# Patient Record
Sex: Male | Born: 1990 | Race: White | Hispanic: No | Marital: Single | Smoking: Current every day smoker
Health system: Southern US, Community
[De-identification: ages and names within clinical notes are randomized; demographics above are authoritative.]

## PROBLEM LIST (undated history)

## (undated) DIAGNOSIS — Z9889 Other specified postprocedural states: Secondary | ICD-10-CM

---

## 2012-09-12 ENCOUNTER — Emergency Department (HOSPITAL_COMMUNITY): Payer: Self-pay

## 2012-09-12 ENCOUNTER — Emergency Department (HOSPITAL_COMMUNITY)
Admission: EM | Admit: 2012-09-12 | Discharge: 2012-09-12 | Disposition: A | Discharge status: Home / Self Care | Payer: Self-pay | Attending: Emergency Medicine | Admitting: Emergency Medicine

## 2012-09-12 ENCOUNTER — Encounter (HOSPITAL_COMMUNITY): Payer: Self-pay | Admitting: Emergency Medicine

## 2012-09-12 DIAGNOSIS — Y9389 Activity, other specified: Secondary | ICD-10-CM | POA: Insufficient documentation

## 2012-09-12 DIAGNOSIS — S43005A Unspecified dislocation of left shoulder joint, initial encounter: Secondary | ICD-10-CM

## 2012-09-12 DIAGNOSIS — Y929 Unspecified place or not applicable: Secondary | ICD-10-CM | POA: Insufficient documentation

## 2012-09-12 DIAGNOSIS — Z9889 Other specified postprocedural states: Secondary | ICD-10-CM | POA: Insufficient documentation

## 2012-09-12 DIAGNOSIS — S43006A Unspecified dislocation of unspecified shoulder joint, initial encounter: Secondary | ICD-10-CM | POA: Insufficient documentation

## 2012-09-12 DIAGNOSIS — R296 Repeated falls: Secondary | ICD-10-CM | POA: Insufficient documentation

## 2012-09-12 DIAGNOSIS — Z880 Allergy status to penicillin: Secondary | ICD-10-CM | POA: Insufficient documentation

## 2012-09-12 HISTORY — DX: Other specified postprocedural states: Z98.890

## 2012-09-12 MED ORDER — MIDAZOLAM HCL 2 MG/2ML IJ SOLN
2.0000 mg | Freq: Once | INTRAMUSCULAR | Status: AC
  Administered 2012-09-12: 2 mg via INTRAVENOUS
  Filled 2012-09-12: qty 2

## 2012-09-12 MED ORDER — FENTANYL CITRATE 0.05 MG/ML IJ SOLN
100.0000 ug | Freq: Once | INTRAMUSCULAR | Status: AC
  Administered 2012-09-12: 100 ug via INTRAVENOUS
  Filled 2012-09-12: qty 2

## 2012-09-12 MED ORDER — OXYCODONE-ACETAMINOPHEN 5-325 MG PO TABS: 1.0000 | ORAL_TABLET | ORAL | Status: DC | PRN

## 2012-09-12 MED ORDER — MIDAZOLAM HCL 2 MG/2ML IJ SOLN
INTRAMUSCULAR | Status: DC | PRN
  Administered 2012-09-12: 2 mg via INTRAVENOUS

## 2012-09-12 MED ORDER — NAPROXEN 500 MG PO TABS: 500.0000 mg | ORAL_TABLET | Freq: Two times a day (BID) | ORAL | Status: DC

## 2012-09-12 MED ORDER — ETOMIDATE 2 MG/ML IV SOLN
20.0000 mg | Freq: Once | INTRAVENOUS | Status: AC
  Administered 2012-09-12: 20 mg via INTRAVENOUS
  Filled 2012-09-12: qty 10

## 2012-09-12 MED ORDER — ETOMIDATE 2 MG/ML IV SOLN: 20.0000 mg/kg | Freq: Once | INTRAVENOUS | Status: DC

## 2012-09-12 MED ORDER — ETOMIDATE 2 MG/ML IV SOLN
INTRAVENOUS | Status: DC | PRN
  Administered 2012-09-12 (×2): 10 mg via INTRAVENOUS

## 2012-09-12 MED ORDER — OXYCODONE-ACETAMINOPHEN 5-325 MG PO TABS
2.0000 | ORAL_TABLET | Freq: Once | ORAL | Status: AC
  Administered 2012-09-12: 2 via ORAL
  Filled 2012-09-12: qty 2

## 2012-09-12 NOTE — ED Provider Notes (Addendum)
History     CSN: [id number]  Arrival date & time 09/12/12  1638   First MD Initiated Contact with Patient 09/12/12 1655      Chief Complaint  Patient presents with  . Dislocation    (Consider location/radiation/quality/duration/timing/severity/associated sxs/prior treatment) HPI Comments: 22 year old male with a history of recurrent left shoulder dislocations who is currently incarcerated who presents with a complaint of recurrent left shoulder pain after sustaining a fall yesterday. He was found to have a dislocated shoulder which was unable to be reduced by the medical team at his facility thus he was transferred to the hospital. This pain is persistent, severe, worse with range of motion, not associated with other acute injuries.  The history is provided by the patient.    Past Medical History  Diagnosis Date  . H/O shoulder surgery     History reviewed. No pertinent past surgical history.  No family history on file.  History  Substance Use Topics  . Smoking status: Not on file  . Smokeless tobacco: Not on file  . Alcohol Use: Not on file      Review of Systems  All other systems reviewed and are negative.    Allergies  Hydrocodone and Penicillins  Home Medications   Current Outpatient Rx  Name  Route  Sig  Dispense  Refill  . acetaminophen-codeine (TYLENOL #3) 300-30 MG per tablet   Oral   Take 2 tablets by mouth once.         . naproxen (NAPROSYN) 500 MG tablet   Oral   Take 1 tablet (500 mg total) by mouth 2 (two) times daily with a meal.   30 tablet   0   . oxyCODONE-acetaminophen (PERCOCET) 5-325 MG per tablet   Oral   Take 1 tablet by mouth every 4 (four) hours as needed for pain.   10 tablet   0     BP 136/59  Pulse 54  Temp(Src) 98.5 F (36.9 C) (Oral)  Resp 20  Ht 6' (1.829 m)  Wt 202 lb (91.627 kg)  BMI 27.39 kg/m2  SpO2 98%  Physical Exam  Nursing note and vitals reviewed. Constitutional: He appears well-developed and  well-nourished. No distress.  HENT:  Head: Normocephalic and atraumatic.  Mouth/Throat: Oropharynx is clear and moist. No oropharyngeal exudate.  Eyes: Conjunctivae and EOM are normal. Pupils are equal, round, and reactive to light. Right eye exhibits no discharge. Left eye exhibits no discharge. No scleral icterus.  Neck: Normal range of motion. Neck supple. No JVD present. No thyromegaly present.  Cardiovascular: Normal rate, regular rhythm, normal heart sounds and intact distal pulses.  Exam reveals no gallop and no friction rub.   No murmur heard. Normal CRT of the L hand  Pulmonary/Chest: Effort normal and breath sounds normal. No respiratory distress. He has no wheezes. He has no rales.  Abdominal: Soft. Bowel sounds are normal. He exhibits no distension and no mass. There is no tenderness.  Musculoskeletal: Normal range of motion. He exhibits tenderness. He exhibits no edema.  Deformity and tenderness of the left shoulder. Decreased range of motion secondary to severe pain.  Lymphadenopathy:    He has no cervical adenopathy.  Neurological: He is alert. Coordination normal.  Normal sensation of the L hand, normal motor of the L hand and forearm  Skin: Skin is warm and dry. No rash noted. No erythema.  Psychiatric: He has a normal mood and affect. His behavior is normal.    ED Course  Procedures (including critical care time)  Labs Reviewed - No data to display No results found.   1. Dislocation of left shoulder joint, initial encounter       MDM  Image to rule out fracture, likely anterior inferior dislocation, sedation, pain meds.  Imaging confirms anterior inferior dislocation of the left shoulder. This is consistent with my exam.  Procedure:  Dislocation Reduction of left Shoulder  Consent:  Description of the procedures as well as Risks of procedure as well as the alternatives and risks of each were explained to the (patient/caregiver).  who has verbally expressed  their understanding.   written consent given by  the patient.  Imaging reviewed, anatomic site of dislocation identified and marked, Patient identity confirmed by arm band and with hospital identification number as well as verbally with patient.  Time Out performed at 5:58 PM.  Patient was prepped and draped in the usual sterile fashion. Sedation used yes.  Sedation type: Moderate.  Type of Sedation used: fentanyl, midazolam and etomidate. Total sedation time: 20 minutes.  Reduction method: traction / countertraction.  Vitals were monitored through the procedure.  Complications: none.  Pt tolerated the procedure without complaints and returned to baseline without difficulty.  Post reduction images were obtained confirming successful reduction of dislocation.  Immobilization applied, Neurovascular status reevaluated and is good with normal pulses, sensation and good capillary refill of the affected extremity.  Reduction films ordered and show interval reduction    Meds given in ED:  Medications  fentaNYL (SUBLIMAZE) injection 100 mcg (100 mcg Intravenous Given 09/12/12 1742)  midazolam (VERSED) injection 2 mg (2 mg Intravenous Given 09/12/12 1811)  etomidate (AMIDATE) injection 20 mg (20 mg Intravenous Given 09/12/12 1811)  oxyCODONE-acetaminophen (PERCOCET/ROXICET) 5-325 MG per tablet 2 tablet (2 tablets Oral Given 09/12/12 2005)    Discharge Medication List as of 09/12/2012  6:45 PM    START taking these medications   Details  naproxen (NAPROSYN) 500 MG tablet Take 1 tablet (500 mg total) by mouth 2 (two) times daily with a meal., Starting 09/12/2012, Until Discontinued, Print          Vida Roller, MD 09/18/12 1610  Vida Roller, MD 09/24/12 1746
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
## 2015-07-11 ENCOUNTER — Emergency Department (HOSPITAL_COMMUNITY)
Admission: EM | Admit: 2015-07-11 | Discharge: 2015-07-11 | Disposition: A | Discharge status: Home / Self Care | Payer: Medicaid Other | Attending: Emergency Medicine | Admitting: Emergency Medicine

## 2015-07-11 ENCOUNTER — Emergency Department (HOSPITAL_COMMUNITY): Payer: Medicaid Other

## 2015-07-11 ENCOUNTER — Encounter (HOSPITAL_COMMUNITY): Payer: Self-pay | Admitting: Emergency Medicine

## 2015-07-11 DIAGNOSIS — Z9889 Other specified postprocedural states: Secondary | ICD-10-CM | POA: Insufficient documentation

## 2015-07-11 DIAGNOSIS — Y9389 Activity, other specified: Secondary | ICD-10-CM | POA: Insufficient documentation

## 2015-07-11 DIAGNOSIS — Z791 Long term (current) use of non-steroidal anti-inflammatories (NSAID): Secondary | ICD-10-CM | POA: Insufficient documentation

## 2015-07-11 DIAGNOSIS — Y998 Other external cause status: Secondary | ICD-10-CM | POA: Insufficient documentation

## 2015-07-11 DIAGNOSIS — X58XXXA Exposure to other specified factors, initial encounter: Secondary | ICD-10-CM | POA: Diagnosis not present

## 2015-07-11 DIAGNOSIS — Z880 Allergy status to penicillin: Secondary | ICD-10-CM | POA: Insufficient documentation

## 2015-07-11 DIAGNOSIS — S4992XA Unspecified injury of left shoulder and upper arm, initial encounter: Secondary | ICD-10-CM | POA: Diagnosis present

## 2015-07-11 DIAGNOSIS — S43005A Unspecified dislocation of left shoulder joint, initial encounter: Secondary | ICD-10-CM | POA: Diagnosis not present

## 2015-07-11 DIAGNOSIS — Y9289 Other specified places as the place of occurrence of the external cause: Secondary | ICD-10-CM | POA: Diagnosis not present

## 2015-07-11 MED ORDER — FENTANYL CITRATE (PF) 100 MCG/2ML IJ SOLN: 100.0000 ug | Freq: Once | INTRAMUSCULAR | Status: DC

## 2015-07-11 MED ORDER — OXYCODONE HCL 5 MG PO TABS: 5.0000 mg | ORAL_TABLET | ORAL | Status: DC | PRN

## 2015-07-11 MED ORDER — FENTANYL CITRATE (PF) 100 MCG/2ML IJ SOLN
100.0000 ug | Freq: Once | INTRAMUSCULAR | Status: AC
  Administered 2015-07-11: 100 ug via INTRAVENOUS
  Filled 2015-07-11: qty 2

## 2015-07-11 NOTE — ED Provider Notes (Signed)
CSN: [id number]     Arrival date & time 07/11/15  2200 History   First MD Initiated Contact with Patient 07/11/15 2211     Chief Complaint  Patient presents with  . Shoulder Injury     (Consider location/radiation/quality/duration/timing/severity/associated sxs/prior Treatment) Patient is a 25 y.o. male presenting with shoulder injury. The history is provided by the patient.  Shoulder Injury This is a recurrent problem. The current episode started less than 1 hour ago. The problem occurs constantly. The problem has not changed since onset.Pertinent negatives include no chest pain, no abdominal pain, no headaches and no shortness of breath. Nothing aggravates the symptoms. Nothing relieves the symptoms. He has tried nothing for the symptoms.   25 yo M With a chief complaint of a left shoulder dislocation. Patient has had many of these in the past. States that he's had about 9 shoulder surgeries. Patient states he reached out to grab something that it dropped and when he caught it his arm dislocated. Happened about an hour ago.   Past Medical History  Diagnosis Date  . H/O shoulder surgery    History reviewed. No pertinent past surgical history. History reviewed. No pertinent family history. Social History  Substance Use Topics  . Smoking status: None  . Smokeless tobacco: None  . Alcohol Use: None    Review of Systems  Constitutional: Negative for fever and chills.  HENT: Negative for congestion and facial swelling.   Eyes: Negative for discharge and visual disturbance.  Respiratory: Negative for shortness of breath.   Cardiovascular: Negative for chest pain and palpitations.  Gastrointestinal: Negative for vomiting, abdominal pain and diarrhea.  Musculoskeletal: Positive for myalgias and arthralgias.  Skin: Negative for color change and rash.  Neurological: Negative for tremors, syncope and headaches.  Psychiatric/Behavioral: Negative for confusion and dysphoric mood.       Allergies  Hydrocodone; Ketamine; and Penicillins  Home Medications   Prior to Admission medications   Medication Sig Start Date End Date Taking? Authorizing Provider  acetaminophen-codeine (TYLENOL #3) 300-30 MG per tablet Take 2 tablets by mouth once.    Historical Provider, MD  naproxen (NAPROSYN) 500 MG tablet Take 1 tablet (500 mg total) by mouth 2 (two) times daily with a meal. 09/12/12   Eber Hong, MD  oxyCODONE (ROXICODONE) 5 MG immediate release tablet Take 1 tablet (5 mg total) by mouth every 4 (four) hours as needed for severe pain. 07/11/15   Melene Plan, DO  oxyCODONE-acetaminophen (PERCOCET) 5-325 MG per tablet Take 1 tablet by mouth every 4 (four) hours as needed for pain. 09/12/12   Eber Hong, MD   BP 140/97 mmHg  Pulse 92  Temp(Src) 98.2 F (36.8 C) (Oral)  Resp 16  SpO2 97% Physical Exam  Constitutional: He is oriented to person, place, and time. He appears well-developed and well-nourished.  HENT:  Head: Normocephalic and atraumatic.  Eyes: EOM are normal. Pupils are equal, round, and reactive to light.  Neck: Normal range of motion. Neck supple. No JVD present.  Cardiovascular: Normal rate and regular rhythm.  Exam reveals no gallop and no friction rub.   No murmur heard. Pulmonary/Chest: No respiratory distress. He has no wheezes.  Abdominal: He exhibits no distension. There is no rebound and no guarding.  Musculoskeletal: Normal range of motion. He exhibits edema and tenderness.  Deformity to the left shoulder.  Old scar to anterior aspect.   Neurological: He is alert and oriented to person, place, and time.  Skin: No rash noted.  No pallor.  Psychiatric: He has a normal mood and affect. His behavior is normal.  Nursing note and vitals reviewed.   ED Course  Reduction of dislocation Date/Time: 07/11/2015 10:35 PM Performed by: Adela LankFLOYD, Anahid Eskelson Authorized by: Melene PlanFLOYD, Blaize Epple Consent: Verbal consent obtained. Risks and benefits: risks, benefits and alternatives  were discussed Consent given by: patient Patient understanding: patient states understanding of the procedure being performed Patient consent: the patient's understanding of the procedure matches consent given Required items: required blood products, implants, devices, and special equipment available Patient identity confirmed: verbally with patient Preparation: Patient was prepped and draped in the usual sterile fashion. Local anesthesia used: no Patient sedated: no Patient tolerance: Patient tolerated the procedure well with no immediate complications Comments: L shoulder reduced, attempted park maneuver without reduction.  Caudal distraction with external rotation with clunk and fix of deformity.    (including critical care time) Labs Review Labs Reviewed - No data to display  Imaging Review No results found. I have personally reviewed and evaluated these images and lab results as part of my medical decision-making.   EKG Interpretation None      MDM   Final diagnoses:  Shoulder dislocation, left, initial encounter    25 yo M with a chief complaint of a left shoulder dislocation. Patient requesting relocation without conscious sedation. Performed at bedside. Placed sling discharge home.  10:37 PM:  I have discussed the diagnosis/risks/treatment options with the patient and believe the pt to be eligible for discharge home to follow-up with Ortho. We also discussed returning to the ED immediately if new or worsening sx occur. We discussed the sx which are most concerning (e.g., sudden worsening pain, fever, inability to tolerate by mouth) that necessitate immediate return. Medications administered to the patient during their visit and any new prescriptions provided to the patient are listed below.  Medications given during this visit Medications  fentaNYL (SUBLIMAZE) injection 100 mcg (100 mcg Intravenous Given 07/11/15 2229)    New Prescriptions   OXYCODONE (ROXICODONE) 5 MG  IMMEDIATE RELEASE TABLET    Take 1 tablet (5 mg total) by mouth every 4 (four) hours as needed for severe pain.    The patient appears reasonably screen and/or stabilized for discharge and I doubt any other medical condition or other [facility] requiring further screening, evaluation, or treatment in the ED at this time prior to discharge.      Melene Planan Jackline Castilla, DO 07/11/15 2237

## 2015-07-11 NOTE — Discharge Instructions (Signed)
Take 4 over the counter ibuprofen tablets 3 times a day or 2 over-the-counter naproxen tablets twice a day for pain.  Shoulder Dislocation Your shoulder joint is made up of 3 bones:  The upper arm bone (humerus).  The shoulder blade (scapula).  The collarbone (clavicle). A shoulder dislocation happens when your upper arm bone moves out of its normal place in your shoulder joint. HOME CARE If You Have a Splint or Sling:  Wear it as told by your doctor.  Take it off only as told by your doctor.  Loosen it if:  Your fingers become numb and tingly.  Your fingers turn cold and blue.  Keep it clean and dry. Bathing  Do not take baths, swim, or use a hot tub until your doctor says you can. Ask your doctor if you can take showers. You may only be allowed to take sponge baths.  If your doctor says taking baths or showers is okay, cover your splint or sling with a plastic bag. Do not let the splint or sling get wet. Managing Pain, Stiffness, and Swelling  If told, put ice on the injured area.  Put ice in a plastic bag.  Place a towel between your skin and the bag.  Leave the ice on for 20 minutes, 2-3 times per day.  Move your fingers often to avoid stiffness and to lessen swelling.  Raise (elevate) the injured area above the level of your heart while you are sitting or lying down. Driving  Do not drive while you are wearing a splint or sling on a hand that you use for driving.  Do not drive or operate heavy machinery while taking pain medicine. Activity  Return to your normal activities as told by your doctor. Ask your doctor what activities are safe for you.  Do range-of-motion exercises only as told by your doctor.  Exercise your hand by squeezing a soft ball. This keeps your hand and wrist from getting stiff and swollen. General Instructions  Take over-the-counter and prescription medicines only as told by your doctor.  Do not use any tobacco products, including  cigarettes, chewing tobacco, or e-cigarettes. Tobacco can slow down healing. If you need help quitting, ask your doctor.  Keep all follow-up visits as told by your doctor. This is important. GET HELP IF:  Your splint or sling gets damaged. GET HELP RIGHT AWAY IF:  Your pain gets worse instead of better.  You lose feeling in your arm or hand.  Your arm or hand turns white and cold.   This information is not intended to replace advice given to you by your health care provider. Make sure you discuss any questions you have with your health care provider.   Document Released: 07/17/2011 Document Revised: 01/13/2015 Document Reviewed: 08/17/2014 Elsevier Interactive Patient Education [employer]2016 [employer] Inc.

## 2015-07-11 NOTE — ED Notes (Signed)
Pt states that he feels that he has popped out his L shoulder. States this has happened before and he has had surgery on this arm before. Alert and oriented.

## 2015-09-15 ENCOUNTER — Emergency Department (HOSPITAL_BASED_OUTPATIENT_CLINIC_OR_DEPARTMENT_OTHER): Payer: Medicaid Other

## 2015-09-15 ENCOUNTER — Encounter (HOSPITAL_BASED_OUTPATIENT_CLINIC_OR_DEPARTMENT_OTHER): Payer: Self-pay | Admitting: Emergency Medicine

## 2015-09-15 ENCOUNTER — Emergency Department (HOSPITAL_BASED_OUTPATIENT_CLINIC_OR_DEPARTMENT_OTHER)
Admission: EM | Admit: 2015-09-15 | Discharge: 2015-09-15 | Disposition: A | Discharge status: Home / Self Care | Payer: Medicaid Other | Attending: Emergency Medicine | Admitting: Emergency Medicine

## 2015-09-15 DIAGNOSIS — Y93F2 Activity, caregiving, lifting: Secondary | ICD-10-CM | POA: Insufficient documentation

## 2015-09-15 DIAGNOSIS — X500XXA Overexertion from strenuous movement or load, initial encounter: Secondary | ICD-10-CM | POA: Diagnosis not present

## 2015-09-15 DIAGNOSIS — F17200 Nicotine dependence, unspecified, uncomplicated: Secondary | ICD-10-CM | POA: Diagnosis not present

## 2015-09-15 DIAGNOSIS — Y999 Unspecified external cause status: Secondary | ICD-10-CM | POA: Insufficient documentation

## 2015-09-15 DIAGNOSIS — S4992XA Unspecified injury of left shoulder and upper arm, initial encounter: Secondary | ICD-10-CM | POA: Diagnosis present

## 2015-09-15 DIAGNOSIS — Y92520 Airport as the place of occurrence of the external cause: Secondary | ICD-10-CM | POA: Insufficient documentation

## 2015-09-15 DIAGNOSIS — S43015A Anterior dislocation of left humerus, initial encounter: Secondary | ICD-10-CM | POA: Diagnosis not present

## 2015-09-15 MED ORDER — OXYCODONE-ACETAMINOPHEN 5-325 MG PO TABS: 1.0000 | ORAL_TABLET | ORAL | Status: AC | PRN

## 2015-09-15 MED ORDER — FENTANYL CITRATE (PF) 100 MCG/2ML IJ SOLN
100.0000 ug | Freq: Once | INTRAMUSCULAR | Status: AC
  Administered 2015-09-15: 100 ug via INTRAVENOUS
  Filled 2015-09-15: qty 2

## 2015-09-15 MED ORDER — ETOMIDATE 2 MG/ML IV SOLN
0.1500 mg/kg | Freq: Once | INTRAVENOUS | Status: AC
  Administered 2015-09-15: 13.74 mg via INTRAVENOUS
  Filled 2015-09-15: qty 10

## 2015-09-15 MED ORDER — IBUPROFEN 800 MG PO TABS
800.0000 mg | ORAL_TABLET | Freq: Once | ORAL | Status: AC
  Administered 2015-09-15: 800 mg via ORAL
  Filled 2015-09-15: qty 1

## 2015-09-15 NOTE — ED Notes (Signed)
MD at bedside. 

## 2015-09-15 NOTE — Sedation Documentation (Signed)
Arm placed back in and xray at bedside for portable

## 2015-09-15 NOTE — ED Provider Notes (Signed)
CSN: [id number]     Arrival date & time 09/15/15  0453 History   First MD Initiated Contact with Patient 09/15/15 0455     Chief Complaint  Patient presents with  . Shoulder Injury     (Consider location/radiation/quality/duration/timing/severity/associated sxs/prior Treatment) HPI This is a 25 year old male with a history of multiple dislocations of the left shoulder for which she has had surgery. [place] [facility] [place] to [place] his child when his left shoulder spontaneously dislocated. He rates his pain as a 7 out of 10 and characterizes it as like previous dislocations. There is no numbness or functional deficit of the left upper extremity distally. He denies other injury.  Past Medical History  Diagnosis Date  . H/O shoulder surgery    History reviewed. No pertinent past surgical history. History reviewed. No pertinent family history. Social History  Substance Use Topics  . Smoking status: Current Every Day Smoker  . Smokeless tobacco: None  . Alcohol Use: No    Review of Systems  All other systems reviewed and are negative.   Allergies  Hydrocodone; Ketamine; and Penicillins  Home Medications   Prior to Admission medications   Medication Sig Start Date End Date Taking? Authorizing Provider  oxyCODONE-acetaminophen (PERCOCET) 5-325 MG tablet Take 1 tablet by mouth every 4 (four) hours as needed. 09/15/15   Roxy Filler, MD   BP 130/78 mmHg  Pulse 59  Temp(Src) 98.9 F (37.2 C) (Oral)  Resp 15  Ht 6' (1.829 m)  Wt 195 lb (88.451 kg)  BMI 26.44 kg/m2  SpO2 100%   Physical Exam  General: Well-developed, well-nourished male in no acute distress; appearance consistent with age of record HENT: normocephalic; atraumatic Eyes: pupils equal, round and reactive to light; extraocular muscles intact Neck: supple Heart: regular rate and rhythm Lungs: clear to auscultation bilaterally Abdomen: soft; nondistended; nontender; bowel sounds  present Extremities: Left shoulder deformity with anterior fullness; full range of motion except left shoulder; pulses normal; tenderness and pain on attempted movement of left shoulder; left upper extremity distally neurovascularly intact Neurologic: Awake, alert and oriented; motor function intact in all extremities and symmetric; no facial droop Skin: Warm and dry Psychiatric: Normal mood and affect    ED Course  Procedures (including critical care time)  Procedural sedation Performed by: Sahil Milner L Consent: Verbal and written consent obtained. Risks and benefits: risks, benefits and alternatives were discussed Required items: required blood products, implants, devices, and special equipment available Patient identity confirmed: arm band and provided demographic data Time out: Immediately prior to procedure a "time out" was called to verify the correct patient, procedure, equipment, support staff and site/side marked as required.  Sedation type: moderate (conscious) sedation NPO time confirmed and considedered  Sedatives: ETOMIDATE  Physician Time at Bedside: 20 minutes  Vitals: Vital signs were monitored during sedation. Cardiac Monitor, pulse oximeter Patient tolerance: Patient tolerated the procedure well with no immediate complications. Comments: Pt with uneventful recovered. Returned to pre-procedural sedation baseline  CLOSED REDUCTION The patient initially wanted to avoid procedural sedation. He was given 100 micrograms of fentanyl and an attempt to reduce the left shoulder by hyperextension was unsuccessful. Informed consent both verbal and written was obtained as noted above. He was given etomidate for sedation. Once adequate sedation was obtained the left shoulder was reduced by hyperextension. He was then placed in a shoulder immobilizer. The patient tolerated this well and there were no immediate complications. The left upper extremity remains distally neurovascularly  intact.   MDM  Nursing notes and vitals signs, including pulse oximetry, reviewed.  Summary of this visit's results, reviewed by myself:  Imaging Studies: Dg Shoulder Left Port  09/15/2015  CLINICAL DATA:  Getting luggage out of a car and felt shoulder popped EXAM: LEFT SHOULDER - 1 VIEW COMPARISON:  09/15/2015 at 04:55 FINDINGS: A single portable view of the shoulder demonstrates improved articular relationships of the glenohumeral joint. No dislocation is evident on this single view. No fracture is evident. IMPRESSION: Improved appearances compared to the earlier study. Electronically Signed   By: Ellery Plunk M.D.   On: 09/15/2015 06:05   Dg Shoulder Left Port  09/15/2015  CLINICAL DATA:  Lifting luggage out of a car, and felt his shoulder popped out. EXAM: LEFT SHOULDER - 1 VIEW COMPARISON:  09/12/2012 FINDINGS: A single portable view of the left shoulder demonstrates at least a significant degree of inferior subluxation at the glenohumeral joint. Dislocation cannot be excluded on this single view. No fracture is evident IMPRESSION: Inferior subluxation of the glenohumeral joint. Cannot entirely exclude a frank dislocation. Electronically Signed   By: Ellery Plunk M.D.   On: 09/15/2015 06:04        Paula Libra, MD 09/15/15 [phone]

## 2015-09-15 NOTE — ED Notes (Signed)
Xray at bedside for portable.

## 2015-09-15 NOTE — ED Notes (Signed)
Pt sitting up in bed calling cab [place] get to airport for flight.  Pt a/o x 4

## 2015-09-15 NOTE — ED Notes (Signed)
Pt states he doesn't want to be sedated due to flight and ask to just use pain medication and try to put back in place.

## 2015-09-15 NOTE — Discharge Instructions (Signed)
Shoulder Dislocation °A shoulder dislocation happens when the upper arm bone (humerus) moves out of the shoulder joint. The shoulder joint is the part of the shoulder where the humerus, shoulder blade (scapula), and collarbone (clavicle) meet. °CAUSES °This condition is often caused by: °· A fall. °· A hit to the shoulder. °· A forceful movement of the shoulder. °RISK FACTORS °This condition is more likely to develop in people who play sports. °SYMPTOMS °Symptoms of this condition include: °· Deformity of the shoulder. °· Intense pain. °· Inability to move the shoulder. °· Numbness, weakness, or tingling in your neck or down your arm. °· Bruising or swelling around your shoulder. °DIAGNOSIS °This condition is diagnosed with a physical exam. After the exam, tests may be done to check for related problems. Tests that may be done include: °· X-ray. This may be done to check for broken bones. °· MRI. This may be done to check for damage to the tissues around the shoulder. °· Electromyogram. This may be done to check for nerve damage. °TREATMENT °This condition is treated with a procedure to place the humerus back in the joint. This procedure is called a reduction. There are two types of reduction: °· Closed reduction. In this procedure, the humerus is placed back in the joint without surgery. The health care provider uses his or her hands to guide the bone back into place. °· Open reduction. In this procedure, the humerus is placed back in the joint with surgery. An open reduction may be recommended if: °¨ You have a weak shoulder joint or weak ligaments. °¨ You have had more than one shoulder dislocation. °¨ The nerves or blood vessels around your shoulder have been damaged. °After the humerus is placed back into the joint, your arm will be placed in a splint or sling to prevent it from moving. You will need to wear the splint or sling until your shoulder heals. When the splint or sling is removed, you may have  physical therapy to help improve the range of motion in your shoulder joint. °HOME CARE INSTRUCTIONS °If You Have a Splint or Sling: °· Wear it as told by your health care provider. Remove it only as told by your health care provider. °· Loosen it if your fingers become numb and tingle, or if they turn cold and blue. °· Keep it clean and dry. °Bathing °· Do not take baths, swim, or use a hot tub until your health care provider approves. Ask your health care provider if you can take showers. You may only be allowed to take sponge baths for bathing. °· If your health care provider approves bathing and showering, cover your splint or sling with a watertight plastic bag to protect it from water. Do not let the splint or sling get wet. °Managing Pain, Stiffness, and Swelling °· If directed, apply ice to the injured area. °¨ Put ice in a plastic bag. °¨ Place a towel between your skin and the bag. °¨ Leave the ice on for 20 minutes, 2-3 times per day. °· Move your fingers often to avoid stiffness and to decrease swelling. °· Raise (elevate) the injured area above the level of your heart while you are sitting or lying down. °Driving °· Do not drive while wearing a splint or sling on a hand that you use for driving. °· Do not drive or operate heavy machinery while taking pain medicine. °Activity °· Return to your normal activities as told by your health care provider. Ask your   health care provider what activities are safe for you. °· Perform range-of-motion exercises only as told by your health care provider. °· Exercise your hand by squeezing a soft ball. This helps to decrease stiffness and swelling in your hand and wrist. °General Instructions °· Take over-the-counter and prescription medicines only as told by your health care provider. °· Do not use any tobacco products, including cigarettes, chewing tobacco, or e-cigarettes. Tobacco can delay bone and tissue healing. If you need help quitting, ask your health care  provider. °· Keep all follow-up visits as told by your health care provider. This is important. °SEEK MEDICAL CARE IF: °· Your splint or sling gets damaged. °SEEK IMMEDIATE MEDICAL CARE IF: °· Your pain gets worse rather than better. °· You lose feeling in your arm or hand. °· Your arm or hand becomes white and cold. °  °This information is not intended to replace advice given to you by your health care provider. Make sure you discuss any questions you have with your health care provider. °  °Document Released: 01/17/2001 Document Revised: 01/13/2015 Document Reviewed: 08/17/2014 °Elsevier Interactive Patient Education ©2016 [employer] ° °

## 2015-09-15 NOTE — ED Notes (Signed)
Unable to place arm back in without sedation

## 2015-09-15 NOTE — Sedation Documentation (Signed)
Immobilizer placed on arm

## 2015-09-15 NOTE — ED Notes (Signed)
Pt states he was [place] and getting luggage out of car and his shoulder popped out.  Hx of dislocation and surgeries in that arm. Left side

## 2020-02-04 ENCOUNTER — Encounter (HOSPITAL_COMMUNITY): Payer: Self-pay

## 2020-02-04 ENCOUNTER — Emergency Department (HOSPITAL_COMMUNITY): Payer: Medicaid Other

## 2020-02-04 ENCOUNTER — Other Ambulatory Visit: Payer: Self-pay

## 2020-02-04 ENCOUNTER — Emergency Department (HOSPITAL_COMMUNITY)
Admission: EM | Admit: 2020-02-04 | Discharge: 2020-02-05 | Disposition: A | Discharge status: Home / Self Care | Payer: Medicaid Other | Attending: Emergency Medicine | Admitting: Emergency Medicine

## 2020-02-04 DIAGNOSIS — Z20822 Contact with and (suspected) exposure to covid-19: Secondary | ICD-10-CM | POA: Insufficient documentation

## 2020-02-04 DIAGNOSIS — F17200 Nicotine dependence, unspecified, uncomplicated: Secondary | ICD-10-CM | POA: Diagnosis not present

## 2020-02-04 DIAGNOSIS — X501XXA Overexertion from prolonged static or awkward postures, initial encounter: Secondary | ICD-10-CM | POA: Insufficient documentation

## 2020-02-04 DIAGNOSIS — S43005A Unspecified dislocation of left shoulder joint, initial encounter: Secondary | ICD-10-CM

## 2020-02-04 DIAGNOSIS — S4992XA Unspecified injury of left shoulder and upper arm, initial encounter: Secondary | ICD-10-CM | POA: Diagnosis present

## 2020-02-04 DIAGNOSIS — G8929 Other chronic pain: Secondary | ICD-10-CM

## 2020-02-04 DIAGNOSIS — S43002A Unspecified subluxation of left shoulder joint, initial encounter: Secondary | ICD-10-CM

## 2020-02-04 DIAGNOSIS — M25512 Pain in left shoulder: Secondary | ICD-10-CM

## 2020-02-04 MED ORDER — FENTANYL CITRATE (PF) 100 MCG/2ML IJ SOLN: 100.0000 ug | Freq: Once | INTRAMUSCULAR | Status: AC

## 2020-02-04 MED ORDER — PROPOFOL 10 MG/ML IV BOLUS
200.0000 mg | Freq: Once | INTRAVENOUS | Status: AC
  Administered 2020-02-04: 200 mg via INTRAVENOUS
  Filled 2020-02-04: qty 20

## 2020-02-04 MED ORDER — FENTANYL CITRATE (PF) 100 MCG/2ML IJ SOLN
INTRAMUSCULAR | Status: AC
  Administered 2020-02-04: 100 ug via INTRAVENOUS
  Filled 2020-02-04: qty 2

## 2020-02-04 MED ORDER — PROPOFOL 10 MG/ML IV BOLUS
INTRAVENOUS | Status: AC
  Filled 2020-02-04: qty 20

## 2020-02-04 MED ORDER — FENTANYL CITRATE (PF) 100 MCG/2ML IJ SOLN
100.0000 ug | Freq: Once | INTRAMUSCULAR | Status: AC
Start: 2020-02-04 — End: 2020-02-04
  Administered 2020-02-04: 100 ug via INTRAVENOUS
  Filled 2020-02-04: qty 2

## 2020-02-04 NOTE — ED Triage Notes (Signed)
Pt reports left shoulder pops out every time he has handcuffs places. Pt in [employer] custody.

## 2020-02-04 NOTE — ED Provider Notes (Signed)
[facility]-EMERGENCY DEPT Provider Note   CSN: [id number] Arrival date & time: 02/04/20  2027    History Shoulder dislocation   Richard Wiley is a 29 y.o. male with past medical history significant for recurrent shoulder dislocation who presents for vaginal shoulder pain.  Patient was being placed under arrest by police. Subsequently dislocated his left shoulder. Has previously had surgery on his shoulder. Was previously followed by [facility] and [facility] with Ortho. No currently being followed by Ortho. Pain a 4/10. No fever, chills, redness, swelling warmth, paresthesias.  History obtained from patient and past medical records. No interpretor was used.  HPI     Past Medical History:  Diagnosis Date  . H/O shoulder surgery     There are no problems to display for this patient.   History reviewed. No pertinent surgical history.     No family history on file.  Social History   Tobacco Use  . Smoking status: Current Every Day Smoker  Substance Use Topics  . Alcohol use: No  . Drug use: No    Home Medications Prior to Admission medications   Medication Sig Start Date End Date Taking? Authorizing Provider  oxyCODONE-acetaminophen (PERCOCET) 5-325 MG tablet Take 1 tablet by mouth every 4 (four) hours as needed. 09/15/15   Molpus, John, MD    Allergies    Hydrocodone, Ketamine, Penicillins, and Hydrocodone-acetaminophen  Review of Systems   Review of Systems  Constitutional: Negative.   HENT: Negative.   Respiratory: Negative.   Cardiovascular: Negative.   Gastrointestinal: Negative.   Genitourinary: Negative.   Musculoskeletal:       Left shoulder dislocation  Skin: Negative.   Neurological: Negative.   All other systems reviewed and are negative.   Physical Exam Updated Vital Signs BP 126/68   Pulse 71   Temp 98.6 F (37 C) (Oral)   Resp 13   Ht 6' (1.829 m)   Wt 97.5 kg   SpO2 98%   BMI 29.16 kg/m   Physical Exam Vitals and  nursing note reviewed.  Constitutional:      General: He is not in acute distress.    Appearance: He is well-developed. He is not ill-appearing, toxic-appearing or diaphoretic.  HENT:     Head: Normocephalic and atraumatic.     Nose: Nose normal.  Eyes:     Pupils: Pupils are equal, round, and reactive to light.  Cardiovascular:     Rate and Rhythm: Normal rate and regular rhythm.     Pulses: Normal pulses.          Radial pulses are 2+ on the right side and 2+ on the left side.  Pulmonary:     Effort: Pulmonary effort is normal. No respiratory distress.     Breath sounds: Normal breath sounds.  Abdominal:     General: Bowel sounds are normal. There is no distension.     Palpations: Abdomen is soft.  Musculoskeletal:     Right shoulder: Normal.     Left shoulder: Decreased range of motion.     Cervical back: Normal range of motion and neck supple.     Comments: Squared off left shoulder with obvious dislocation. No erythema, warmth.  Skin:    General: Skin is warm and dry.     Capillary Refill: Capillary refill takes less than 2 seconds.  Neurological:     Mental Status: He is alert.     Sensory: Sensation is intact.     Motor: Motor  function is intact.     Coordination: Coordination is intact.     Comments: Intact sensation to BUE Equal hand grip bilaterally    ED Results / Procedures / Treatments   Labs (all labs ordered are listed, but only abnormal results are displayed) Labs Reviewed - No data to display  EKG None  Radiology DG Shoulder Left  Result Date: 02/04/2020 CLINICAL DATA:  Left shoulder pain. EXAM: LEFT SHOULDER - 2+ VIEW COMPARISON:  August 23, 2018 FINDINGS: Anterior and inferior dislocation of the left humeral head is seen with respect to the left glenoid. No associated fracture is identified. There is no evidence of arthropathy or other focal bone abnormality. Soft tissues are unremarkable. IMPRESSION: Anterior and inferior dislocation of the left  shoulder. Electronically Signed   By: Aram Candela M.D.   On: 02/04/2020 21:43   DG Shoulder Left Portable  Result Date: 02/05/2020 CLINICAL DATA:  Status post reduction. EXAM: LEFT SHOULDER COMPARISON:  February 04, 2020 (9:27 p.m.) FINDINGS: There is no evidence of fracture or dislocation. There is no evidence of arthropathy or other focal bone abnormality. Soft tissues are unremarkable. IMPRESSION: Successful reduction of the left shoulder dislocation seen on the prior study. Electronically Signed   By: Aram Candela M.D.   On: 02/05/2020 00:07    Procedures .Ortho Injury Treatment  Date/Time: 02/05/2020 12:09 AM Performed by: Ralph Leyden A, PA-C Authorized by: Linwood Dibbles, PA-C   Consent:    Consent obtained:  Verbal   Consent given by:  Patient   Risks discussed:  Fracture, nerve damage, restricted joint movement, vascular damage, recurrent dislocation, irreducible dislocation and stiffness   Alternatives discussed:  No treatment, alternative treatment, immobilization, referral and delayed treatmentInjury location: shoulder Location details: left shoulder Injury type: dislocation Dislocation type: anterior Hill-Sachs deformity: no Chronicity: recurrent Pre-procedure neurovascular assessment: neurovascularly intact Pre-procedure distal perfusion: normal Pre-procedure neurological function: normal Pre-procedure range of motion: normal Anesthesia: see MAR for details  Anesthesia: Local anesthesia used: yes  Patient sedated: Yes. Refer to sedation procedure documentation for details of sedation. Manipulation performed: yes Reduction method: scapular manipulation, traction and counter traction and external rotation Reduction successful: yes X-ray confirmed reduction: yes Immobilization: sling Post-procedure neurovascular assessment: post-procedure neurovascularly intact Post-procedure distal perfusion: normal Post-procedure neurological function:  normal Post-procedure range of motion: normal Patient tolerance: patient tolerated the procedure well with no immediate complications  Reduction of dislocation  Date/Time: 02/05/2020 12:11 AM Performed by: Linwood Dibbles, PA-C Authorized by: Linwood Dibbles, PA-C  Preparation: Patient was prepped and draped in the usual sterile fashion. Local anesthesia used: no  Anesthesia: Local anesthesia used: no  Sedation: Patient sedated: yes Sedatives: see MAR for details  Patient tolerance: patient tolerated the procedure well with no immediate complications Comments: See attending MD Dr. Clarene Duke for sedation procedure    (including critical care time)  Medications Ordered in ED Medications  propofol (DIPRIVAN) 10 mg/mL bolus/IV push (  Not Given 02/05/20 0009)  propofol (DIPRIVAN) 10 mg/mL bolus/IV push (40 mg Intravenous Given 02/04/20 2350)  fentaNYL (SUBLIMAZE) injection 100 mcg (100 mcg Intravenous Given 02/04/20 2258)  fentaNYL (SUBLIMAZE) injection 100 mcg (100 mcg Intravenous Given 02/04/20 2317)  propofol (DIPRIVAN) 10 mg/mL bolus/IV push 200 mg (200 mg Intravenous Given 02/04/20 2345)   ED Course  I have reviewed the triage vital signs and the nursing notes.  Pertinent labs & imaging results that were available during my care of the patient were reviewed by me and considered in my  medical decision making (see chart for details).  29 year old with recurrent shoulder dislocations presents for evaluation of left shoulder dislocation.  He is neurovascularly intact.  Patient requesting IV pain medication and attempt at reduction as he states this is worked in the past.  I have reviewed his past medical records and he has been seen multiple times at outpatient facilities for recurrent left shoulder dislocation.  Is not currently followed by Ortho.  Has needed sedation previously.  Patient with multiple attempts at reduction of shoulder with Fentanyl. Ultimatly successful after  Propofol. See note from attending for sedation note. NV intact after reduction. Xray shoulder personally reviewed and interpreted with successful reduction.  Care transferred to Muthersbaugh, PA-C at shift change. Plan to monitor after sedation. Likely dc home.  Clinical Course as of Feb 04 13  Thu Feb 05, 2020  0007 Plan: monitor after sedation.    [HM]    Clinical Course User Index [HM] Muthersbaugh, Boyd Kerbs   MDM Rules/Calculators/A&P                          Final Clinical Impression(s) / ED Diagnoses Final diagnoses:  Dislocation of left shoulder joint, initial encounter    Rx / DC Orders ED Discharge Orders    None       Emmelia Holdsworth A, PA-C 02/05/20 0014    Little, Ambrose Finland, MD 02/09/20 1252

## 2020-02-05 ENCOUNTER — Emergency Department (HOSPITAL_COMMUNITY): Payer: Medicaid Other

## 2020-02-05 ENCOUNTER — Emergency Department (HOSPITAL_COMMUNITY)
Admission: EM | Admit: 2020-02-05 | Discharge: 2020-02-05 | Disposition: A | Discharge status: Home / Self Care | Payer: Medicaid Other | Source: Home / Self Care | Attending: Emergency Medicine | Admitting: Emergency Medicine

## 2020-02-05 ENCOUNTER — Ambulatory Visit (HOSPITAL_COMMUNITY): Payer: Medicaid Other

## 2020-02-05 DIAGNOSIS — F17200 Nicotine dependence, unspecified, uncomplicated: Secondary | ICD-10-CM | POA: Insufficient documentation

## 2020-02-05 DIAGNOSIS — S43005A Unspecified dislocation of left shoulder joint, initial encounter: Secondary | ICD-10-CM | POA: Insufficient documentation

## 2020-02-05 DIAGNOSIS — X501XXA Overexertion from prolonged static or awkward postures, initial encounter: Secondary | ICD-10-CM | POA: Insufficient documentation

## 2020-02-05 DIAGNOSIS — M24412 Recurrent dislocation, left shoulder: Secondary | ICD-10-CM

## 2020-02-05 MED ORDER — METHOCARBAMOL 1000 MG/10ML IJ SOLN: 1000.0000 mg | Freq: Once | INTRAMUSCULAR | Status: DC

## 2020-02-05 MED ORDER — FENTANYL CITRATE (PF) 100 MCG/2ML IJ SOLN
100.0000 ug | Freq: Once | INTRAMUSCULAR | Status: AC
  Administered 2020-02-05: 100 ug via INTRAVENOUS
  Filled 2020-02-05: qty 2

## 2020-02-05 MED ORDER — OXYCODONE-ACETAMINOPHEN 5-325 MG PO TABS
1.0000 | ORAL_TABLET | Freq: Once | ORAL | Status: AC
  Administered 2020-02-05: 1 via ORAL
  Filled 2020-02-05: qty 1

## 2020-02-05 MED ORDER — PROPOFOL 10 MG/ML IV BOLUS
INTRAVENOUS | Status: AC | PRN
  Administered 2020-02-05: 100 mg via INTRAVENOUS
  Administered 2020-02-05: 30 mg via INTRAVENOUS
  Administered 2020-02-05: 50 mg via INTRAVENOUS
  Administered 2020-02-05: 20 mg via INTRAVENOUS
  Administered 2020-02-05: 80 mg via INTRAVENOUS

## 2020-02-05 MED ORDER — PROPOFOL 10 MG/ML IV BOLUS
100.0000 mg | Freq: Once | INTRAVENOUS | Status: DC
  Filled 2020-02-05: qty 20

## 2020-02-05 MED ORDER — OXYCODONE HCL 5 MG PO TABS
5.0000 mg | ORAL_TABLET | Freq: Once | ORAL | Status: AC
  Administered 2020-02-05: 5 mg via ORAL
  Filled 2020-02-05: qty 1

## 2020-02-05 MED ORDER — PROPOFOL 10 MG/ML IV BOLUS
INTRAVENOUS | Status: AC | PRN
  Administered 2020-02-05: 100 mg via INTRAVENOUS

## 2020-02-05 MED ORDER — PROPOFOL 10 MG/ML IV BOLUS
0.5000 mg/kg | Freq: Once | INTRAVENOUS | Status: DC
  Filled 2020-02-05: qty 20

## 2020-02-05 MED ORDER — PROPOFOL 10 MG/ML IV BOLUS
INTRAVENOUS | Status: AC
  Filled 2020-02-05: qty 20

## 2020-02-05 MED ORDER — METHOCARBAMOL 1000 MG/10ML IJ SOLN
1000.0000 mg | Freq: Once | INTRAVENOUS | Status: AC
  Administered 2020-02-05: 1000 mg via INTRAVENOUS
  Filled 2020-02-05: qty 1000

## 2020-02-05 MED ORDER — PROPOFOL 10 MG/ML IV BOLUS
INTRAVENOUS | Status: AC | PRN
  Administered 2020-02-04: 40 mg via INTRAVENOUS

## 2020-02-05 NOTE — Discharge Instructions (Signed)
Please read and follow all provided instructions.  Your diagnoses today include:  1. Chronic dislocation of left shoulder     Tests performed today include:  An x-ray of the affected area - shows dislocation  Vital signs. See below for your results today.   Medications prescribed:   None  Take any prescribed medications only as directed.  Home care instructions:   Follow any educational materials contained in this packet  Follow R.I.C.E. Protocol:  R - rest your injury   I  - use ice on injury without applying directly to skin  C - compress injury with bandage or splint  E - elevate the injury as much as possible  Follow-up instructions: It is very important that you follow-up with your orthopedic surgeon when you are able to.  It is very likely that you will have recurrent dislocations of your left shoulder.  Please use sling and avoid manipulation of your left arm and shoulder.  Return instructions:   Please return if your fingers are numb or tingling, appear gray or blue, or you have severe pain (also elevate the arm and loosen splint or wrap if you were given one)  Please return to the Emergency Department if you experience worsening symptoms.   Please return if you have any other emergent concerns.  Additional Information:  Your vital signs today were: BP 124/87    Pulse 91    Temp 98.2 F (36.8 C)    Resp 19    SpO2 96%  If your blood pressure (BP) was elevated above 135/85 this visit, please have this repeated by your doctor within one month. --------------

## 2020-02-05 NOTE — ED Notes (Signed)
Patient verbalizes understanding of discharge instructions. Opportunity for questioning and answers were provided. Pt discharged from ED ambulatory with [facility].

## 2020-02-05 NOTE — Progress Notes (Signed)
Patient ID: Richard Wiley, male   DOB: [birth year], 29 y.o.   MRN: [id number]  Asked to advise ED staff on management for this patient. Given multiple shoulder surgeries in the [place] area and long-standing history of multiple dislocations he is likely a chronic or voluntary dislocator. It does not appear we have access to any of his records through Care Everywhere. For short term would recommend sling and swathe immobilization and then transfer to [facility] where he may have access to the surgeon or practice that's been treating him.    Freeman Caldron, PA-C Orthopedic Surgery [phone]

## 2020-02-05 NOTE — ED Provider Notes (Signed)
Care assumed from [facility].  Please see her full H&P.  In short,  Richard Wiley is a 29 y.o. male presents for left shoulder dislocation.  Pt given propofol for reduction.  Successful reduction with PA-C Henderly and Dr. Clarene Duke.  Remains sedated with sling in place.  Physical Exam  BP 126/68    Pulse 71    Temp 98.6 F (37 C) (Oral)    Resp 13    Ht 6' (1.829 m)    Wt 97.5 kg    SpO2 98%    BMI 29.16 kg/m   Physical Exam Vitals and nursing note reviewed.  Constitutional:      General: He is not in acute distress.    Appearance: He is well-developed.  HENT:     Head: Normocephalic.  Eyes:     General: No scleral icterus.    Conjunctiva/sclera: Conjunctivae normal.  Cardiovascular:     Rate and Rhythm: Normal rate.     Pulses:          Radial pulses are 2+ on the left side.  Pulmonary:     Effort: Pulmonary effort is normal.  Musculoskeletal:        General: Normal range of motion.     Left shoulder: Deformity and tenderness present. Decreased strength.     Cervical back: Normal range of motion.     Comments: Recurrent anterior dislocation  Skin:    General: Skin is warm and dry.  Neurological:     Mental Status: He is alert.     ED Course/Procedures   Clinical Course as of Feb 05 439  Thu Feb 05, 2020  0007 Plan: monitor after sedation.    [HM]    Clinical Course User Index [HM] Gissela Bloch, Boyd Kerbs    Reduction of dislocation  Date/Time: 02/05/2020 1:35 AM Performed by: Dierdre Forth, PA-C Authorized by: Dierdre Forth, PA-C  Preparation: Patient was prepped and draped in the usual sterile fashion. Local anesthesia used: no  Anesthesia: Local anesthesia used: no  Sedation: Patient sedated: yes Sedation type: moderate (conscious) sedation Sedatives: propofol Analgesia: fentanyl Vitals: Vital signs were monitored during sedation.  Patient tolerance: patient tolerated the procedure well with no immediate complications Comments:  Successful reduction     CT Shoulder Left Wo Contrast  Result Date: 02/05/2020 CLINICAL DATA:  Shoulder dislocation postreduction. EXAM: CT OF THE UPPER LEFT EXTREMITY WITHOUT CONTRAST TECHNIQUE: Multidetector CT imaging of the upper left extremity was performed according to the standard protocol. COMPARISON:  Radiographs 09/29-30/2021 FINDINGS: Bones/Joint/Cartilage There is still slight residual anterior subluxation of the humeral head within the glenoid fossa although no overt dislocation is identified. Irregularity over the lateral aspect of the humeral head probably represents an impaction fracture. The glenoid appears intact. Coracoclavicular and acromioclavicular spaces are maintained. Degenerative changes are present in the glenohumeral joint with subcortical cysts on both sides of the joint. No bone erosions. Visualized ribs appear intact. Vestigial rib at C7. Ligaments Suboptimally assessed by CT. Muscles and Tendons Normal appearance of the shoulder musculature. Soft tissues No soft tissue swelling or hematoma. No significant lymphadenopathy. IMPRESSION: 1. There is still slight residual anterior subluxation of the humeral head within the glenoid fossa although no overt dislocation is identified. 2. Irregularity over the lateral aspect of the humeral head consistent with a Hill-Sachs impaction fracture. 3. Degenerative changes in the glenohumeral joint. Electronically Signed   By: Burman Nieves M.D.   On: 02/05/2020 03:35   DG Shoulder Left  Result Date: 02/04/2020  CLINICAL DATA:  Left shoulder pain. EXAM: LEFT SHOULDER - 2+ VIEW COMPARISON:  August 23, 2018 FINDINGS: Anterior and inferior dislocation of the left humeral head is seen with respect to the left glenoid. No associated fracture is identified. There is no evidence of arthropathy or other focal bone abnormality. Soft tissues are unremarkable. IMPRESSION: Anterior and inferior dislocation of the left shoulder. Electronically Signed    By: Aram Candela M.D.   On: 02/04/2020 21:43   DG Shoulder Left Portable  Result Date: 02/05/2020 CLINICAL DATA:  Post reduction, PA felt redislocation during positioning of the shoulder EXAM: LEFT SHOULDER COMPARISON:  Radiographs 02/04/2020 FINDINGS: Slight inferomedial translation of the humeral head compared to most recent radiographs with some rotation of the humeral head may reflect a recurrent anterior shoulder dislocation perched upon the glenoid rim. Difficult to fully assess given the absence of a lateral view. No other acute or suspicious osseous abnormalities. Soft tissue swelling of the shoulder is again seen. IMPRESSION: Appearance suggesting a recurrent, possibly perched dislocation. Difficult to fully assess in frontal only radiograph. Electronically Signed   By: Kreg Shropshire M.D.   On: 02/05/2020 02:03   DG Shoulder Left Portable  Result Date: 02/05/2020 CLINICAL DATA:  Status post reduction. EXAM: LEFT SHOULDER COMPARISON:  February 04, 2020 (9:27 p.m.) FINDINGS: There is no evidence of fracture or dislocation. There is no evidence of arthropathy or other focal bone abnormality. Soft tissues are unremarkable. IMPRESSION: Successful reduction of the left shoulder dislocation seen on the prior study. Electronically Signed   By: Aram Candela M.D.   On: 02/05/2020 00:07     MDM    Pt presents after L shoulder dislocation.  Successfully reduced in the ED but required significant sedation.  Pt sleeping at this time. May be d/c into [employer] custody when alert and tolerating PO.  12:45 AM Pt awoke with recurrent anterior dislocation.  Will need repeat reduction.    The patient was discussed with and seen by Dr. Preston Fleeting who agrees with the treatment plan and will participate in repeat reduction.  1:54 AM Pt with successful reduction of left shoulder, but dislocation/subluxation reoccurs after waking before post reduction film.  Concern for possible redislocation on repeat film.     2:13 AM Discussed with Dr. Ave Filter.  Repeat films not clearly dislocated.  Recommends axillary view and possibly CT scan for further evaluation.  4:20 AM CT shows subluxation.  I personally reviewed the images.  Discussed with Dr. Ave Filter, ortho, who has also reviewed the images.  He believes there may be a hematoma in the joint creating mild subluxation, but does not believe there is additional intervention that should be attempted at this time.  Recommends discharge with sling and outpatient follow-up.  On reassessment, pt remains with good pulses and sensation in the left upper extremity.  He continues to complain of pain. Oxycodone given in the ED.      Dislocation of left shoulder joint, initial encounter  Chronic left shoulder pain  Shoulder subluxation, left, initial encounter      Milta Deiters 02/05/20 0441    Dione Booze, MD 02/05/20 [phone]

## 2020-02-05 NOTE — ED Triage Notes (Signed)
Pt bib law enforcement with reports of L shoulder dislocation. Seen yesterday for same. States when they handcuffed him this morning it popped back out of place. CNS intact.

## 2020-02-05 NOTE — Discharge Instructions (Addendum)
Follow up with Orthopedics as directed.  Keep arm in sling over the next 3-4 days  Return for new or worsening symptoms including numbness or weakness

## 2020-02-05 NOTE — ED Provider Notes (Signed)
[facility] EMERGENCY DEPARTMENT Provider Note   CSN: [id number] Arrival date & time: 02/05/20  1135     History Chief Complaint  Patient presents with  . Dislocation    Richard Wiley is a 29 y.o. male.  Patient with history of recurrent left shoulder dislocations, status post multiple surgeries --presents for recurrent dislocation.  Patient states that while in police custody, his left arm was placed behind his back in order to be placed in handcuffs.  When they did this, his shoulder dislocated.  Patient was at [facility] late last night and early this morning where he had two reduction attempts and a CT to confirm relocation.  Patient denies numbness or tingling.  He reports significant shoulder pain, worse with movement.  States that it feels similar when it has been dislocated in the past.        Past Medical History:  Diagnosis Date  . H/O shoulder surgery     There are no problems to display for this patient.   No past surgical history on file.     No family history on file.  Social History   Tobacco Use  . Smoking status: Current Every Day Smoker  Substance Use Topics  . Alcohol use: No  . Drug use: No    Home Medications Prior to Admission medications   Medication Sig Start Date End Date Taking? Authorizing Provider  oxyCODONE-acetaminophen (PERCOCET) 5-325 MG tablet Take 1 tablet by mouth every 4 (four) hours as needed. 09/15/15   Molpus, John, MD    Allergies    Hydrocodone, Ketamine, Penicillins, and Hydrocodone-acetaminophen  Review of Systems   Review of Systems  Cardiovascular: Negative for chest pain.  Gastrointestinal: Negative for abdominal pain, diarrhea, nausea and vomiting.  Genitourinary: Negative for dysuria and hematuria.  Musculoskeletal: Positive for arthralgias and myalgias.  Skin: Negative for rash.  Neurological: Negative for numbness.    Physical Exam Updated Vital Signs BP 128/85   Pulse (!) 58    Temp 98 F (36.7 C) (Oral)   Resp 16   SpO2 99%   Physical Exam Vitals and nursing note reviewed.  Constitutional:      Appearance: He is well-developed.  HENT:     Head: Normocephalic and atraumatic.  Eyes:     General:        Right eye: No discharge.        Left eye: No discharge.     Conjunctiva/sclera: Conjunctivae normal.  Cardiovascular:     Rate and Rhythm: Normal rate and regular rhythm.     Heart sounds: Normal heart sounds.  Pulmonary:     Effort: Pulmonary effort is normal.     Breath sounds: Normal breath sounds.  Abdominal:     Palpations: Abdomen is soft.     Tenderness: There is no abdominal tenderness.  Musculoskeletal:     Left shoulder: Deformity, tenderness and bony tenderness present. Decreased range of motion.     Left upper arm: No tenderness or bony tenderness.     Cervical back: Normal range of motion and neck supple.     Comments: Defect noted just inferior to acromion consistent with shoulder dislocation.   Skin:    General: Skin is warm and dry.  Neurological:     Mental Status: He is alert.     ED Results / Procedures / Treatments   Labs (all labs ordered are listed, but only abnormal results are displayed) Labs Reviewed - No data to display  EKG None  Radiology CT Shoulder Left Wo Contrast  Result Date: 02/05/2020 CLINICAL DATA:  Shoulder dislocation postreduction. EXAM: CT OF THE UPPER LEFT EXTREMITY WITHOUT CONTRAST TECHNIQUE: Multidetector CT imaging of the upper left extremity was performed according to the standard protocol. COMPARISON:  Radiographs 09/29-30/2021 FINDINGS: Bones/Joint/Cartilage There is still slight residual anterior subluxation of the humeral head within the glenoid fossa although no overt dislocation is identified. Irregularity over the lateral aspect of the humeral head probably represents an impaction fracture. The glenoid appears intact. Coracoclavicular and acromioclavicular spaces are maintained. Degenerative  changes are present in the glenohumeral joint with subcortical cysts on both sides of the joint. No bone erosions. Visualized ribs appear intact. Vestigial rib at C7. Ligaments Suboptimally assessed by CT. Muscles and Tendons Normal appearance of the shoulder musculature. Soft tissues No soft tissue swelling or hematoma. No significant lymphadenopathy. IMPRESSION: 1. There is still slight residual anterior subluxation of the humeral head within the glenoid fossa although no overt dislocation is identified. 2. Irregularity over the lateral aspect of the humeral head consistent with a Hill-Sachs impaction fracture. 3. Degenerative changes in the glenohumeral joint. Electronically Signed   By: Burman Nieves M.D.   On: 02/05/2020 03:35   DG Shoulder Left  Result Date: 02/05/2020 CLINICAL DATA:  Left shoulder dislocation. EXAM: LEFT SHOULDER - 2+ VIEW COMPARISON:  Same day.  February 04, 2020. FINDINGS: There is noted anterior dislocation of the proximal left humerus relative to the glenoid fossa. No definite fracture is noted. IMPRESSION: Anterior left shoulder dislocation. Electronically Signed   By: Lupita Raider M.D.   On: 02/05/2020 12:24   DG Shoulder Left  Result Date: 02/04/2020 CLINICAL DATA:  Left shoulder pain. EXAM: LEFT SHOULDER - 2+ VIEW COMPARISON:  August 23, 2018 FINDINGS: Anterior and inferior dislocation of the left humeral head is seen with respect to the left glenoid. No associated fracture is identified. There is no evidence of arthropathy or other focal bone abnormality. Soft tissues are unremarkable. IMPRESSION: Anterior and inferior dislocation of the left shoulder. Electronically Signed   By: Aram Candela M.D.   On: 02/04/2020 21:43   DG Shoulder Left Portable  Result Date: 02/05/2020 CLINICAL DATA:  Post reduction, PA felt redislocation during positioning of the shoulder EXAM: LEFT SHOULDER COMPARISON:  Radiographs 02/04/2020 FINDINGS: Slight inferomedial translation of  the humeral head compared to most recent radiographs with some rotation of the humeral head may reflect a recurrent anterior shoulder dislocation perched upon the glenoid rim. Difficult to fully assess given the absence of a lateral view. No other acute or suspicious osseous abnormalities. Soft tissue swelling of the shoulder is again seen. IMPRESSION: Appearance suggesting a recurrent, possibly perched dislocation. Difficult to fully assess in frontal only radiograph. Electronically Signed   By: Kreg Shropshire M.D.   On: 02/05/2020 02:03   DG Shoulder Left Portable  Result Date: 02/05/2020 CLINICAL DATA:  Status post reduction. EXAM: LEFT SHOULDER COMPARISON:  February 04, 2020 (9:27 p.m.) FINDINGS: There is no evidence of fracture or dislocation. There is no evidence of arthropathy or other focal bone abnormality. Soft tissues are unremarkable. IMPRESSION: Successful reduction of the left shoulder dislocation seen on the prior study. Electronically Signed   By: Aram Candela M.D.   On: 02/05/2020 00:07    Procedures Procedures (including critical care time)  Medications Ordered in ED Medications  propofol (DIPRIVAN) 10 mg/mL bolus/IV push 100 mg (has no administration in time range)  propofol (DIPRIVAN) 10 mg/mL bolus/IV push (  50 mg Intravenous Given 02/05/20 1310)  oxyCODONE-acetaminophen (PERCOCET/ROXICET) 5-325 MG per tablet 1 tablet (1 tablet Oral Given 02/05/20 1228)  fentaNYL (SUBLIMAZE) injection 100 mcg (100 mcg Intravenous Given 02/05/20 1241)    ED Course  I have reviewed the triage vital signs and the nursing notes.  Pertinent labs & imaging results that were available during my care of the patient were reviewed by me and considered in my medical decision making (see chart for details).  Patient seen and examined. Work-up initiated.  Reviewed reduction attempts and evaluation from last night including CT imaging.  Vital signs reviewed and are as follows: BP 131/65   Pulse 64    Temp 98.2 F (36.8 C) (Temporal)   Resp 16   SpO2 93%   Patient discussed with and seen by Dr. Stevie Kern.  Attempt made to relocate shoulder using Cunningham technique after administration of 100 mcg of fentanyl.  This was unsuccessful.  Patient was then sedated, and similar to ED visit earlier this morning, appeared to be successfully reduced, however when he sat up the shoulder spontaneously dislocated.  I spoke with [place], PA-C of orthopedic surgery will discuss case with orthopedics and provide recommendations.  3:30 PM Discussed with ortho --they advised that dislocation may be chronic, or this may be a severe subluxation.  Patient will need to follow-up with his surgeon at [facility] when able.  We made 1 more attempt to relocate the patient's shoulder under sedation with propofol.  Please see note by Dr. Stevie Kern.  Patient was placed in sling and arm was wrapped with Ace wrap to provide as much stability as possible.  Patient requesting discharge at this time.    MDM Rules/Calculators/A&P                          Patient with chronic dislocation of the left shoulder, multiple attempts at reduction over the past 24 hours with continued dislocations.  Orthopedic surgery was consulted last night, and recommendations obtained again during ED stay today.  Unfortunately, patient has extreme laxity and we have been only able to temporarily reduce the shoulder here.  Patient does not have any current surgical options here.  He is encouraged to follow-up with his surgeons at [facility] for further recommendations.  Upper extremity is neurovascularly intact.    Final Clinical Impression(s) / ED Diagnoses Final diagnoses:  Chronic dislocation of left shoulder    Rx / DC Orders ED Discharge Orders    None       Renne Crigler, PA-C 02/05/20 1547    Milagros Loll, MD 02/09/20 [phone]

## 2020-02-09 NOTE — ED Provider Notes (Signed)
.  Sedation  Date/Time: 02/04/2020 11:30 PM Performed by: Laurence Spates, MD Authorized by: Laurence Spates, MD   Consent:    Consent obtained:  Written   Consent given by:  Patient   Risks discussed:  Allergic reaction, inadequate sedation, respiratory compromise necessitating ventilatory assistance and intubation, prolonged hypoxia resulting in organ damage and prolonged sedation necessitating reversal   Alternatives discussed:  Analgesia without sedation Universal protocol:    Immediately prior to procedure a time out was called: yes     Patient identity confirmation method:  Arm band and verbally with patient Indications:    Procedure performed:  Dislocation reduction   Procedure necessitating sedation performed by:  Different physician Pre-sedation assessment:    Time since last food or drink:  4 hours   NPO status caution: urgency dictates proceeding with non-ideal NPO status     ASA classification: class 1 - normal, healthy patient     Neck mobility: normal     Mallampati score:  I - soft palate, uvula, fauces, pillars visible   Pre-sedation assessments completed and reviewed: airway patency, cardiovascular function, mental status, pain level and respiratory function   Immediate pre-procedure details:    Reassessment: Patient reassessed immediately prior to procedure     Reviewed: vital signs     Verified: bag valve mask available, emergency equipment available, intubation equipment available, IV patency confirmed, oxygen available and suction available   Procedure details (see MAR for exact dosages):    Preoxygenation:  Nasal cannula   Sedation:  Propofol   Intended level of sedation: deep   Intra-procedure monitoring:  Blood pressure monitoring, cardiac monitor, continuous capnometry, continuous pulse oximetry, frequent LOC assessments and frequent vital sign checks   Intra-procedure events: none     Total Provider sedation time (minutes):  15 Post-procedure  details:    Attendance: Constant attendance by certified staff until patient recovered     Recovery: Patient returned to pre-procedure baseline     Post-sedation assessments completed and reviewed: airway patency, cardiovascular function, mental status and pain level     Patient is stable for discharge or admission: yes     Patient tolerance:  Tolerated well, no immediate complications      Hazell Siwik, Ambrose Finland, MD 02/09/20 1257

## 2020-02-09 NOTE — ED Provider Notes (Addendum)
.  Sedation  Date/Time: 02/09/2020 8:49 AM Performed by: Milagros Loll, MD Authorized by: Milagros Loll, MD   Consent:    Consent given by:  Patient   Risks discussed:  Allergic reaction, dysrhythmia, inadequate sedation, nausea, respiratory compromise necessitating ventilatory assistance and intubation, vomiting, prolonged sedation necessitating reversal and prolonged hypoxia resulting in organ damage Universal protocol:    Immediately prior to procedure a time out was called: yes   Pre-sedation assessment:    Time since last food or drink:  Greater than 6 hours   ASA classification: class 1 - normal, healthy patient     Mallampati score:  I - soft palate, uvula, fauces, pillars visible   Pre-sedation assessments completed and reviewed: airway patency, cardiovascular function, hydration status, mental status, nausea/vomiting, pain level, respiratory function and temperature   Immediate pre-procedure details:    Reassessment: Patient reassessed immediately prior to procedure     Reviewed: vital signs     Verified: bag valve mask available, emergency equipment available, intubation equipment available, IV patency confirmed, oxygen available, reversal medications available and suction available   Procedure details (see MAR for exact dosages):    Preoxygenation:  Room air   Analgesia:  Fentanyl   Intra-procedure events: none     Total Provider sedation time (minutes):  25 Post-procedure details:    Attendance: Constant attendance by certified staff until patient recovered     Recovery: Patient returned to pre-procedure baseline     Post-sedation assessments completed and reviewed: airway patency, cardiovascular function, hydration status, mental status, nausea/vomiting, pain level, respiratory function and temperature     Patient is stable for discharge or admission: yes     Patient tolerance:  Tolerated well, no immediate complications .Ortho Injury Treatment  Date/Time:  02/09/2020 8:52 AM Performed by: Milagros Loll, MD Authorized by: Milagros Loll, MD   Consent:    Consent obtained:  Verbal and written   Consent given by:  Patient   Risks discussed:  Irreducible dislocation, fracture, nerve damage, recurrent dislocation, stiffness, restricted joint movement and vascular damage   Alternatives discussed:  No treatment, alternative treatment, immobilization, referral and delayed treatmentInjury location: shoulder Location details: left shoulder Injury type: dislocation Dislocation type: anterior Hill-Sachs deformity: no Chronicity: recurrent Pre-procedure neurovascular assessment: neurovascularly intact Pre-procedure distal perfusion: normal Pre-procedure neurological function: normal Pre-procedure range of motion: normal  Patient sedated: Yes. Refer to sedation procedure documentation for details of sedation. Manipulation performed: yes Reduction method: traction and counter traction Immobilization: sling Post-procedure neurovascular assessment: post-procedure neurovascularly intact Post-procedure distal perfusion: normal Post-procedure neurological function: normal Post-procedure range of motion: normal    Comments: Initial attempt at reduction made bedside using Cunningham, unsuccessful.  Second attempt made using propofol sedation utilizing traction countertraction, successful reduction but patient quickly redislocated.  Third attempt using propofol sedation utilizing traction countertraction method was again successful however again patient almost immediately redislocated after he came out of sedation.   Milagros Loll, MD 02/09/20 [id number]    Milagros Loll, MD 02/09/20 0900

## 2021-07-27 IMAGING — CT CT SHOULDER*L* W/O CM
2 of 3 series · 6 of 20 positions shown, 7 images · non-contrast
Comparison: Radiographs [REDACTED]/3336

CLINICAL DATA: Shoulder dislocation postreduction.

EXAM:
CT OF THE UPPER LEFT EXTREMITY WITHOUT CONTRAST
TECHNIQUE: Multidetector CT imaging of the upper left extremity was performed
according to the standard protocol.

[Series 8: ax st · axial · 0.39mm/px · z∈[-521,-361]mm · 3 of 81 slices shown, 4 images]
[im 1/81  soft-tissue]
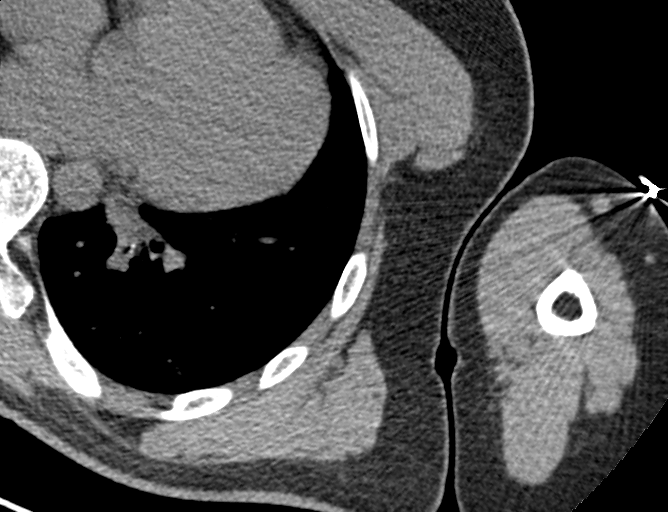
[im 1/81  bone]
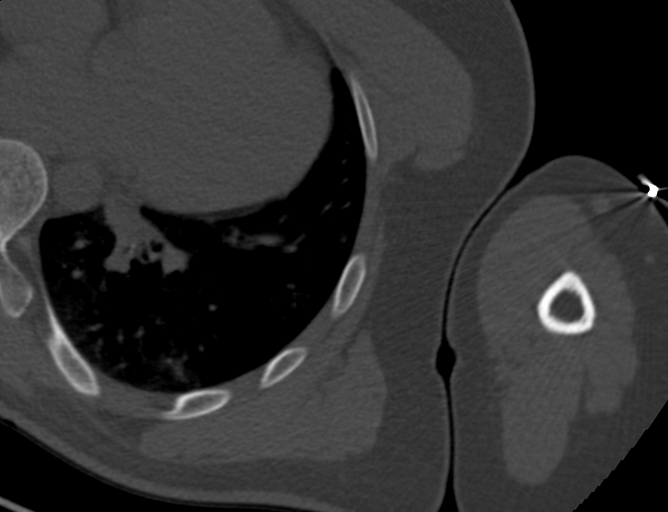
[im 41/81  bone]
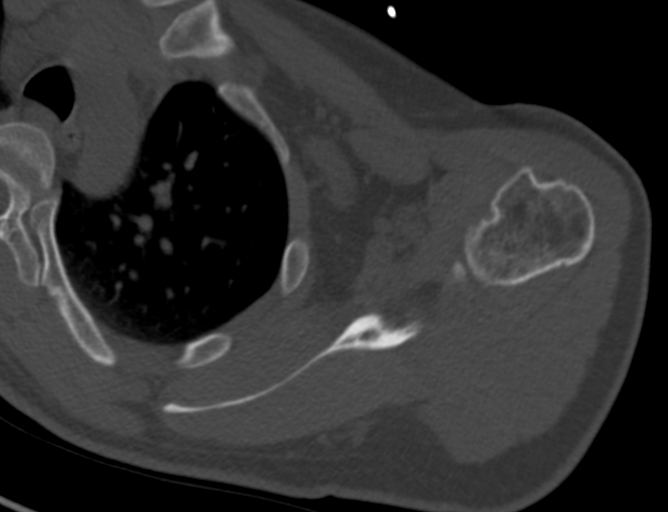
[im 81/81  bone]
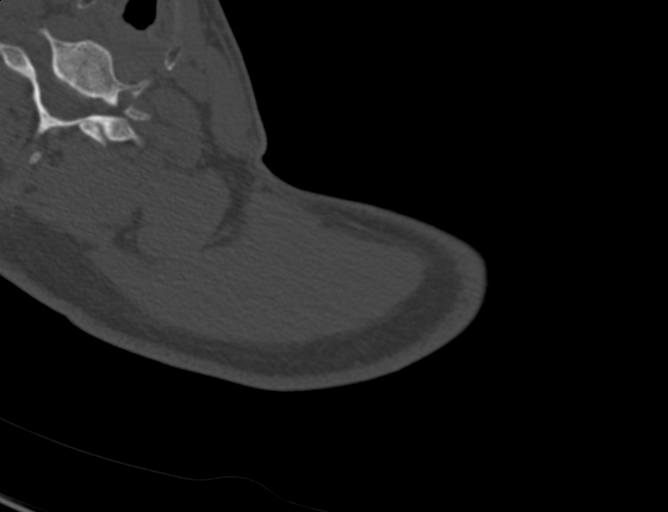

[Series 9: cor st · coronal · 0.32mm/px · 3 of 98 slices shown]
[im 20/98  bone]
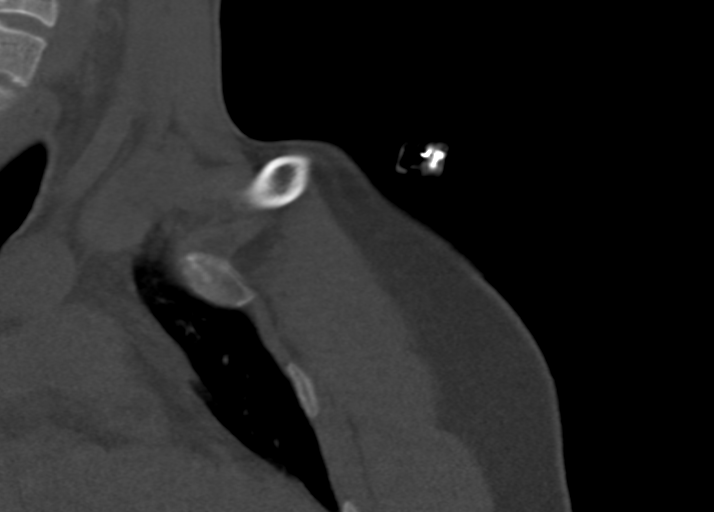
[im 39/98  bone]
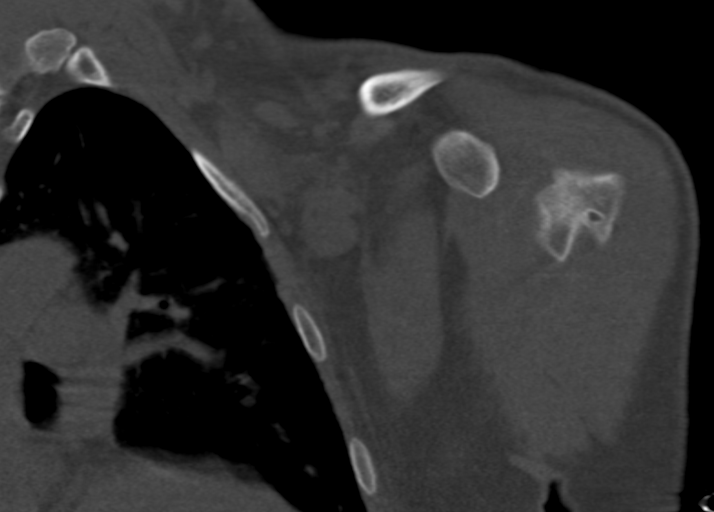
[im 59/98  bone]
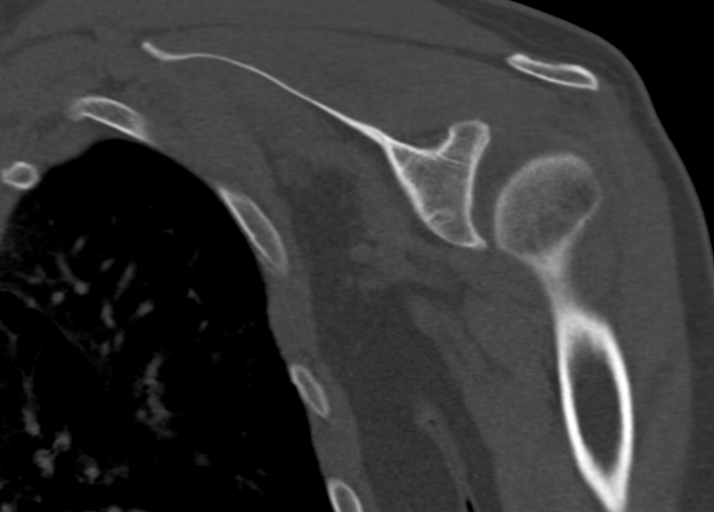

[6 of 20 positions shown; findings below may reference images not displayed]

FINDINGS: Bones/Joint/Cartilage

There is still slight residual anterior subluxation of the humeral
head within the glenoid fossa although no overt dislocation is
identified. Irregularity over the lateral aspect of the humeral head
probably represents an impaction fracture. The glenoid appears
intact. Coracoclavicular and acromioclavicular spaces are
maintained. Degenerative changes are present in the glenohumeral
joint with subcortical cysts on both sides of the joint. No bone
erosions. Visualized ribs appear intact. Vestigial rib at C7.

Ligaments

Suboptimally assessed by CT.

Muscles and Tendons

Normal appearance of the shoulder musculature.

Soft tissues

No soft tissue swelling or hematoma. No significant lymphadenopathy.
IMPRESSION: 1. There is still slight residual anterior subluxation of the
humeral head within the glenoid fossa although no overt dislocation
is identified.
2. Irregularity over the lateral aspect of the humeral head
consistent with a Hill-Sachs impaction fracture.
3. Degenerative changes in the glenohumeral joint.

## 2021-07-27 IMAGING — DX DG SHOULDER 2+V*L*
2 series · 2 of 2 positions shown · non-contrast
Comparison: Same day radiographs

CLINICAL DATA: Left shoulder dislocation status post reduction

EXAM:
LEFT SHOULDER - 2+ VIEW

[shoulder swimmer]
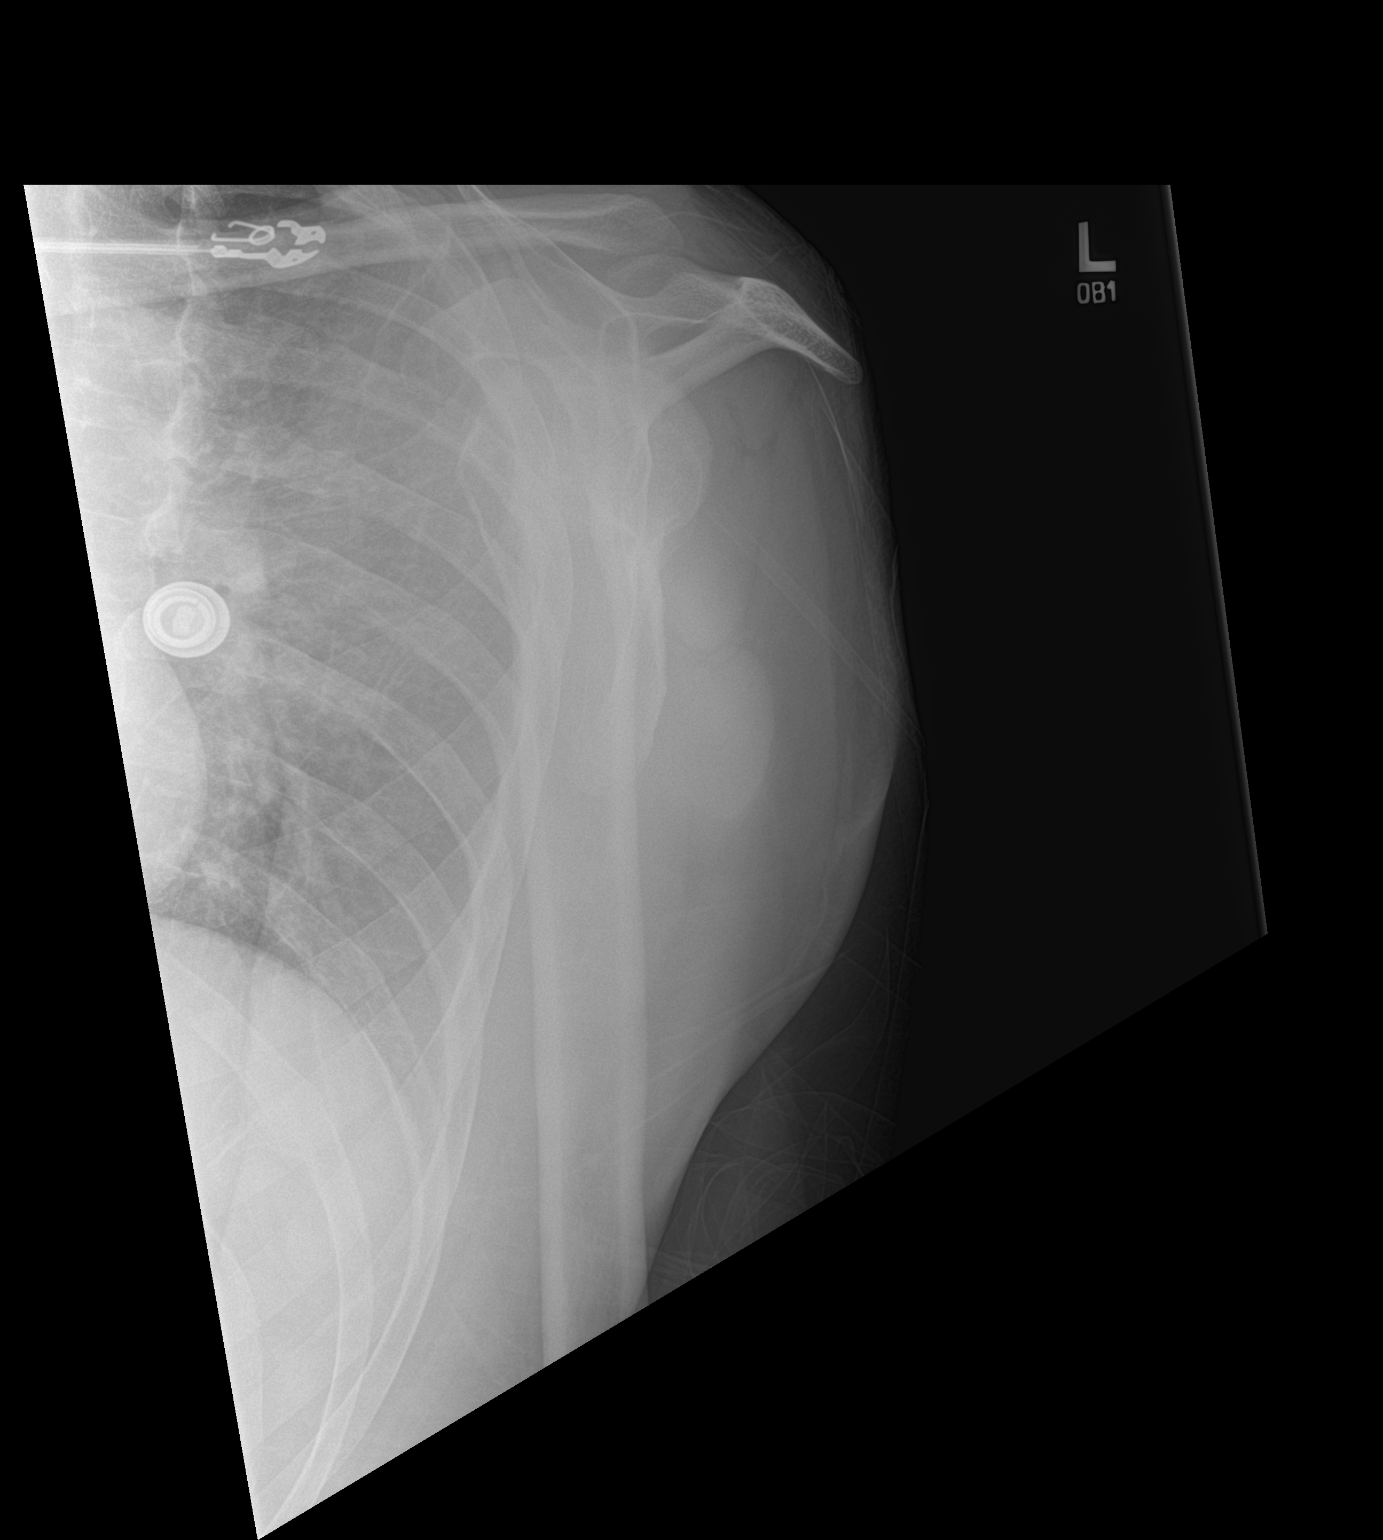

[shoulder ap]
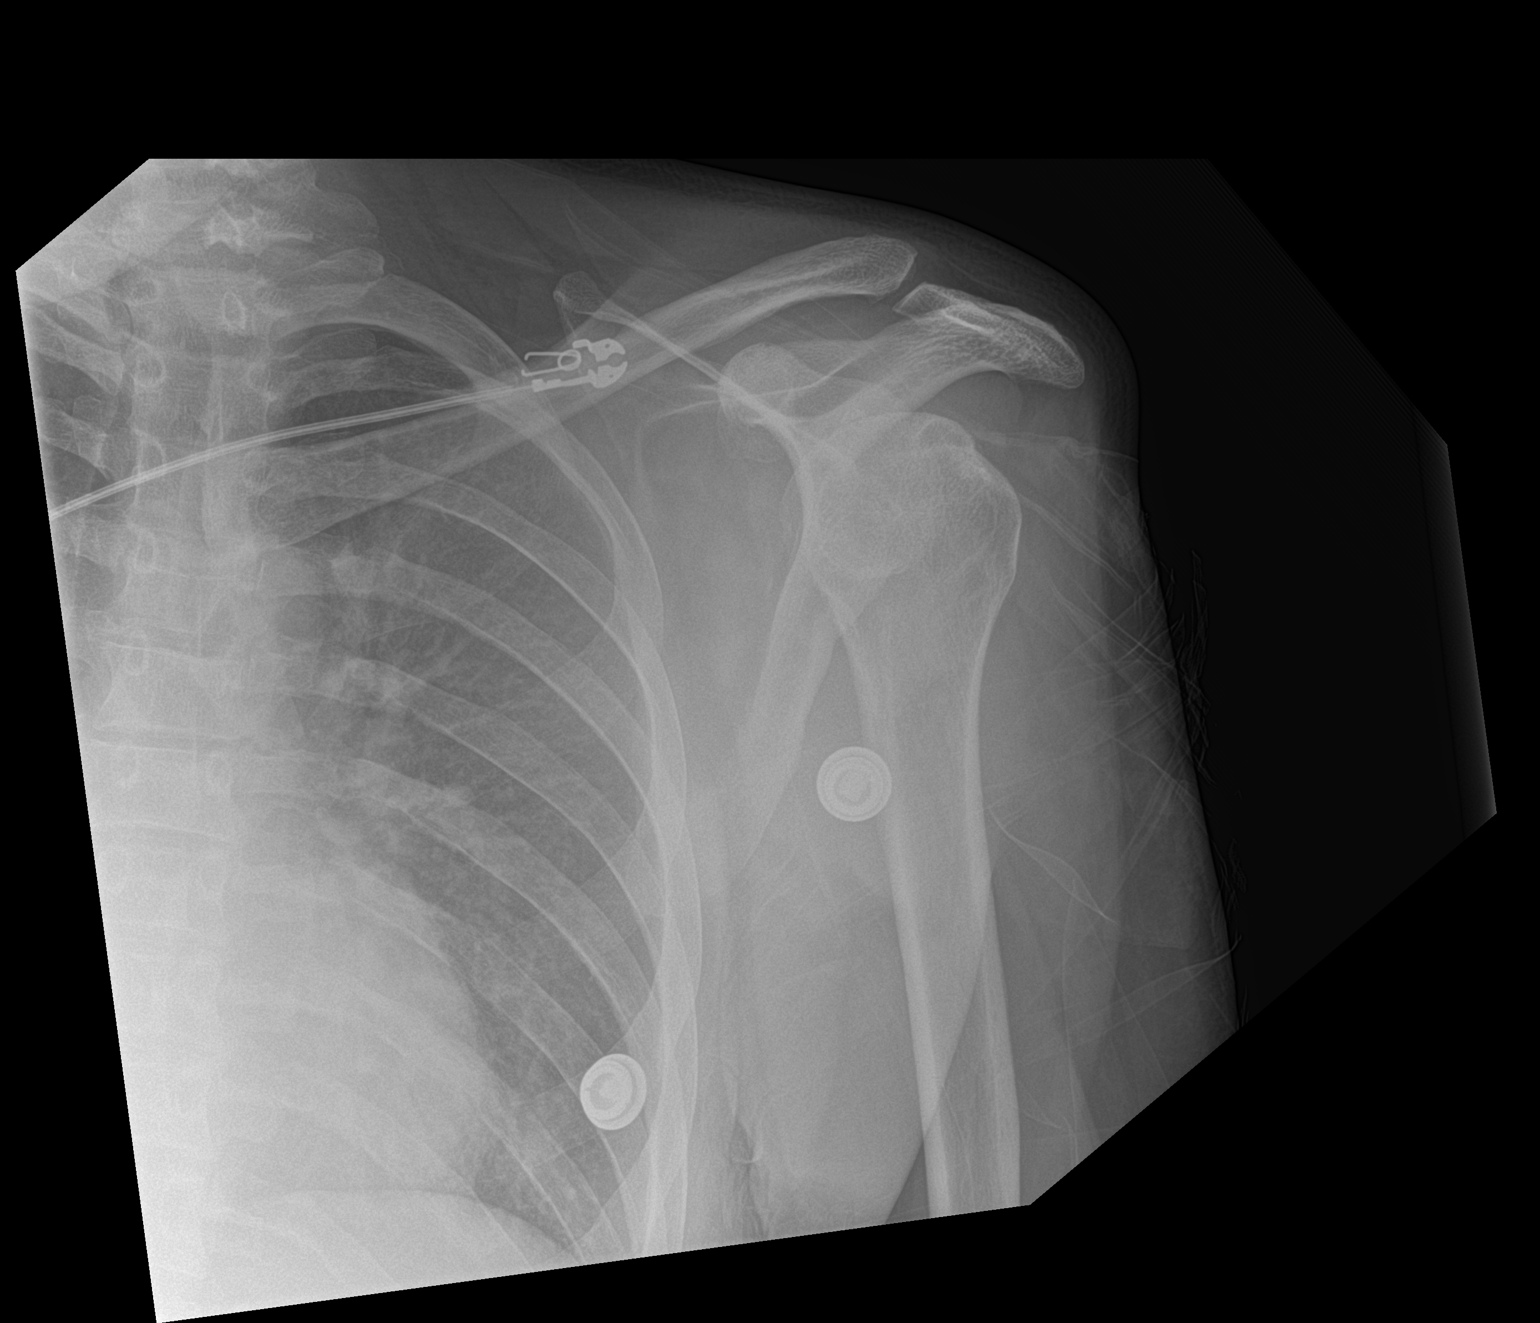

[2 of 2 positions shown; findings below may reference images not displayed]

FINDINGS: There is overlap of the humeral head and glenoid on the AP view
which may in part be projectional as the humeral head appears
appropriately positioned relative to the glenoid on scapular Y-view.
Kindra Darius PACS impaction deformity is again noted. No
additional fractures.
IMPRESSION: There is overlap of the humeral head and glenoid on the AP view
which may in part be projectional as the humeral head appears
appropriately positioned relative to the glenoid on scapular Y-view.
Repeat four view shoulder series could be performed to confirm
successful reduction.
